# Patient Record
Sex: Male | Born: 1985 | Race: White | Hispanic: No | Marital: Single | Smoking: Never smoker
Health system: Southern US, Community
[De-identification: ages and names within clinical notes are randomized; demographics above are authoritative.]

---
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
## 2006-12-12 ENCOUNTER — Ambulatory Visit: Payer: Self-pay | Admitting: General Surgery

## 2006-12-12 ENCOUNTER — Encounter: Admission: RE | Admit: 2006-12-12 | Discharge: 2006-12-12 | Payer: Self-pay | Admitting: General Surgery

## 2007-11-12 ENCOUNTER — Ambulatory Visit: Payer: Self-pay

## 2007-11-15 ENCOUNTER — Ambulatory Visit: Payer: Self-pay

## 2007-11-19 ENCOUNTER — Ambulatory Visit: Payer: Self-pay

## 2007-11-26 ENCOUNTER — Ambulatory Visit: Payer: Self-pay

## 2007-12-03 ENCOUNTER — Ambulatory Visit: Payer: Self-pay

## 2007-12-06 ENCOUNTER — Ambulatory Visit: Payer: Self-pay

## 2008-01-02 ENCOUNTER — Ambulatory Visit: Payer: Self-pay

## 2008-01-07 ENCOUNTER — Ambulatory Visit: Payer: Self-pay

## 2008-01-28 ENCOUNTER — Ambulatory Visit: Payer: Self-pay

## 2008-02-04 ENCOUNTER — Ambulatory Visit: Payer: Self-pay

## 2008-02-24 DIAGNOSIS — E032 Hypothyroidism due to medicaments and other exogenous substances: Secondary | ICD-10-CM | POA: Insufficient documentation

## 2008-05-19 IMAGING — CR DG CHEST 1V
1 series · 1 of 1 positions shown · non-contrast
Comparison: none

CLINICAL DATA: Hodgkin?s disease.  Recent biopsy with pneumothorax. 
 ONE VIEW CHEST:

[w chest pa]
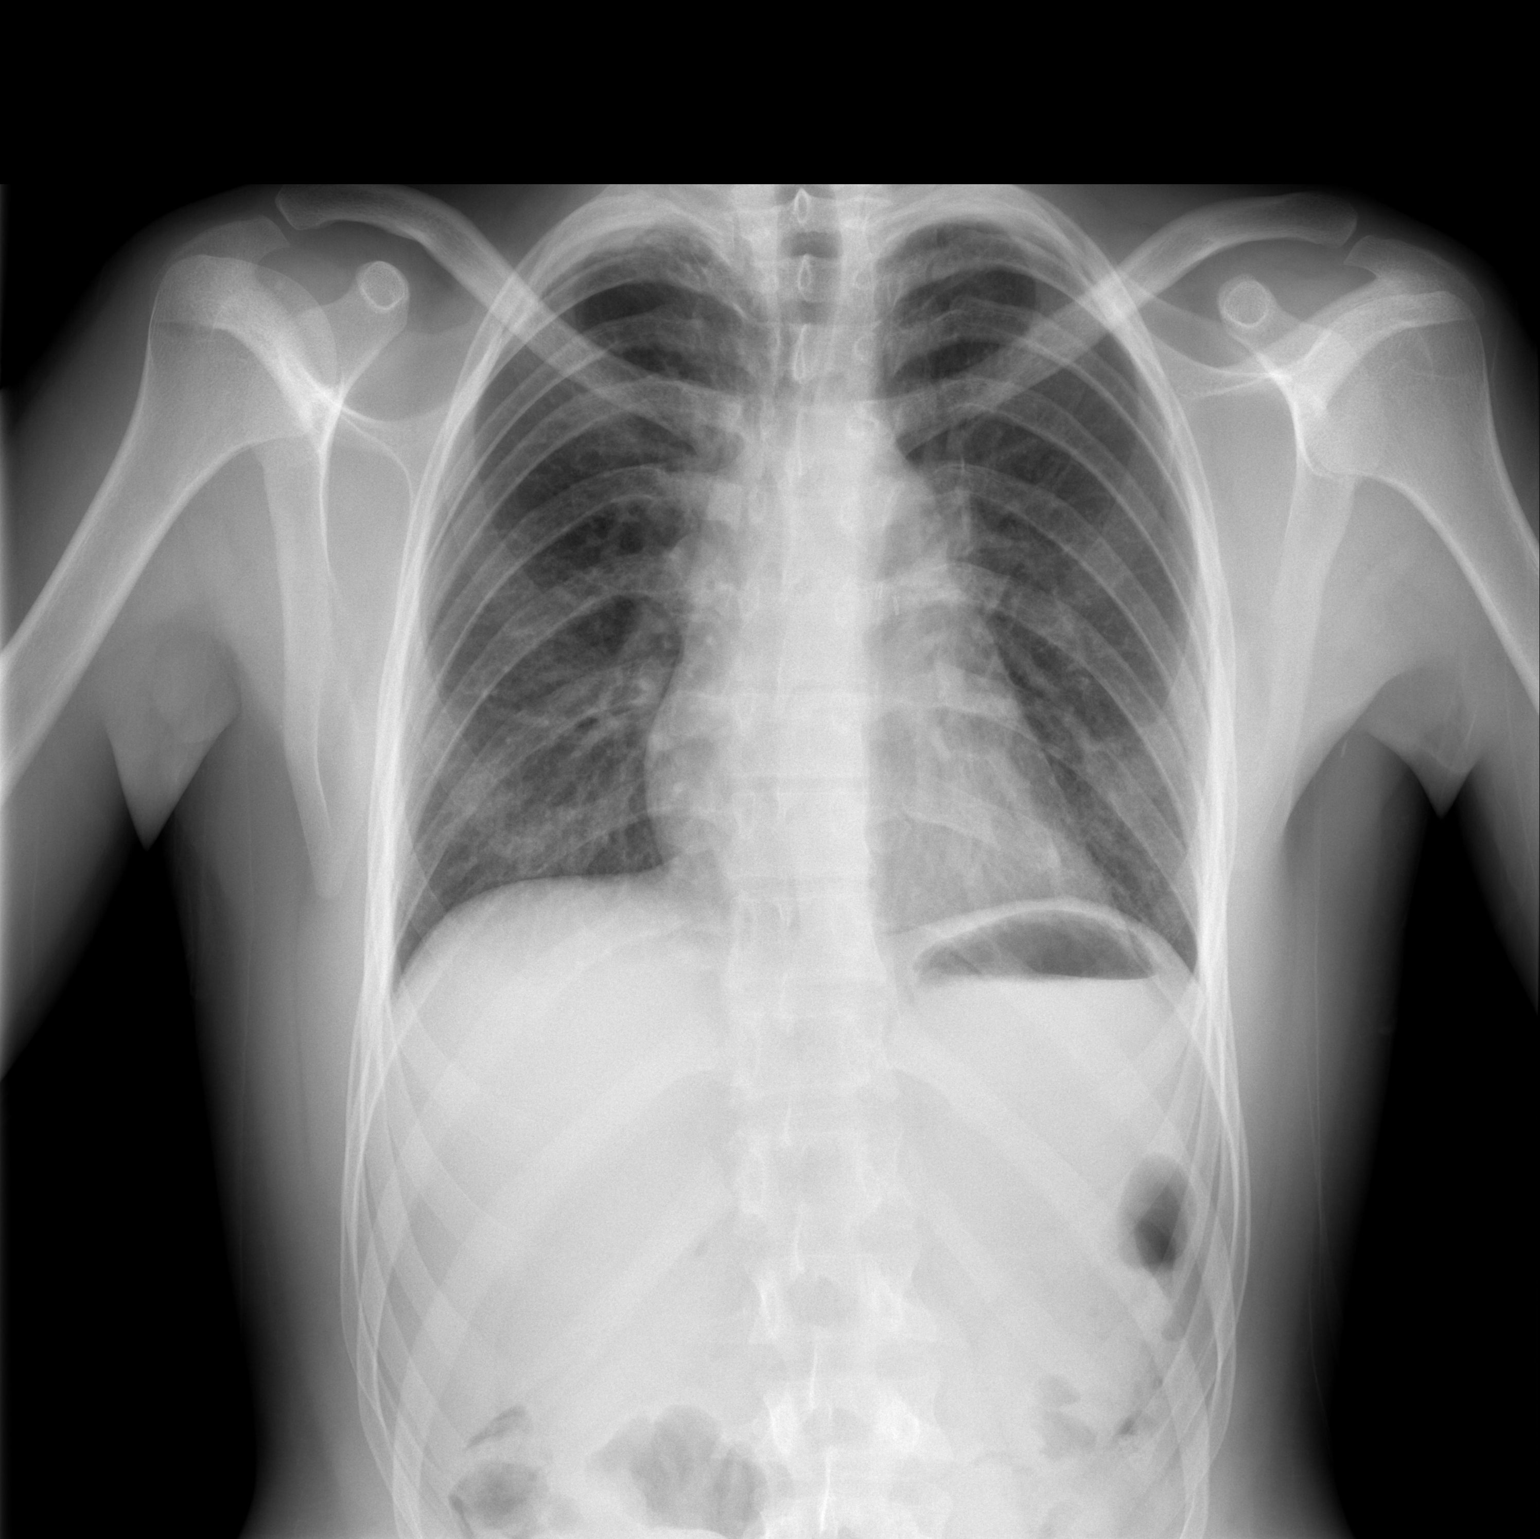

[1 of 1 positions shown; findings below may reference images not displayed]

FINDINGS: There is fullness of the superior mediastinum which may be due to adenopathy.  A surgical clip overlies the left hilum.  No pneumothorax or pleural effusion is seen.  The interstitial markings are prominent.  There is no confluent air space opacity.
IMPRESSION: No evidence of pneumothorax.  Interstitial prominence and possible mediastinal adenopathy ? correlate with prior imaging studies.

## 2012-10-17 DIAGNOSIS — C8190 Hodgkin lymphoma, unspecified, unspecified site: Secondary | ICD-10-CM | POA: Insufficient documentation

## 2012-10-17 DIAGNOSIS — J939 Pneumothorax, unspecified: Secondary | ICD-10-CM | POA: Insufficient documentation

## 2013-04-04 DIAGNOSIS — D239 Other benign neoplasm of skin, unspecified: Secondary | ICD-10-CM | POA: Insufficient documentation

## 2013-04-04 DIAGNOSIS — J8410 Pulmonary fibrosis, unspecified: Secondary | ICD-10-CM | POA: Insufficient documentation

## 2015-02-16 DIAGNOSIS — J42 Unspecified chronic bronchitis: Secondary | ICD-10-CM | POA: Insufficient documentation

## 2015-10-26 DIAGNOSIS — D849 Immunodeficiency, unspecified: Secondary | ICD-10-CM | POA: Insufficient documentation

## 2015-10-26 DIAGNOSIS — Z942 Lung transplant status: Secondary | ICD-10-CM | POA: Insufficient documentation

## 2015-11-02 DIAGNOSIS — B259 Cytomegaloviral disease, unspecified: Secondary | ICD-10-CM | POA: Insufficient documentation

## 2016-05-18 DIAGNOSIS — Z8619 Personal history of other infectious and parasitic diseases: Secondary | ICD-10-CM | POA: Insufficient documentation

## 2016-05-18 DIAGNOSIS — Z8709 Personal history of other diseases of the respiratory system: Secondary | ICD-10-CM | POA: Insufficient documentation

## 2016-05-18 DIAGNOSIS — Z9889 Other specified postprocedural states: Secondary | ICD-10-CM | POA: Insufficient documentation

## 2016-06-07 DIAGNOSIS — J9809 Other diseases of bronchus, not elsewhere classified: Secondary | ICD-10-CM | POA: Insufficient documentation

## 2016-07-18 DIAGNOSIS — J479 Bronchiectasis, uncomplicated: Secondary | ICD-10-CM | POA: Insufficient documentation

## 2016-08-16 DIAGNOSIS — D801 Nonfamilial hypogammaglobulinemia: Secondary | ICD-10-CM | POA: Insufficient documentation

## 2019-01-25 DIAGNOSIS — Z85820 Personal history of malignant melanoma of skin: Secondary | ICD-10-CM | POA: Insufficient documentation

## 2019-06-06 DIAGNOSIS — R634 Abnormal weight loss: Secondary | ICD-10-CM | POA: Insufficient documentation

## 2019-07-30 DIAGNOSIS — D89811 Chronic graft-versus-host disease: Secondary | ICD-10-CM | POA: Insufficient documentation

## 2019-12-27 DIAGNOSIS — C678 Malignant neoplasm of overlapping sites of bladder: Secondary | ICD-10-CM | POA: Insufficient documentation

## 2020-01-18 DIAGNOSIS — D591 Autoimmune hemolytic anemia, unspecified: Secondary | ICD-10-CM | POA: Insufficient documentation

## 2020-01-24 DIAGNOSIS — R7689 Other specified abnormal immunological findings in serum: Secondary | ICD-10-CM | POA: Insufficient documentation

## 2020-01-24 DIAGNOSIS — R768 Other specified abnormal immunological findings in serum: Secondary | ICD-10-CM | POA: Insufficient documentation

## 2020-04-01 DIAGNOSIS — C689 Malignant neoplasm of urinary organ, unspecified: Secondary | ICD-10-CM | POA: Insufficient documentation

## 2020-05-08 DIAGNOSIS — H40043 Steroid responder, bilateral: Secondary | ICD-10-CM | POA: Insufficient documentation

## 2021-02-19 DIAGNOSIS — J95811 Postprocedural pneumothorax: Secondary | ICD-10-CM | POA: Insufficient documentation

## 2021-04-12 DIAGNOSIS — E161 Other hypoglycemia: Secondary | ICD-10-CM | POA: Insufficient documentation

## 2021-04-12 DIAGNOSIS — T86810 Lung transplant rejection: Secondary | ICD-10-CM | POA: Insufficient documentation

## 2021-10-07 ENCOUNTER — Encounter: Payer: Self-pay | Admitting: *Deleted

## 2021-10-07 ENCOUNTER — Encounter
Payer: BC Managed Care – PPO | Attending: Student in an Organized Health Care Education/Training Program | Admitting: *Deleted

## 2021-10-07 VITALS — Ht 71.0 in | Wt 119.0 lb

## 2021-10-07 DIAGNOSIS — Z942 Lung transplant status: Secondary | ICD-10-CM | POA: Insufficient documentation

## 2021-10-07 DIAGNOSIS — R0609 Other forms of dyspnea: Secondary | ICD-10-CM | POA: Diagnosis present

## 2021-10-07 NOTE — Patient Instructions (Addendum)
Patient Instructions  Patient Details  Name: Daniel Vaughan MRN: [id number] Date of Birth: August 17, 1985 Referring Provider:  Whitson, [place],*  Below are your personal goals for exercise, nutrition, and risk factors. Our goal is to help you stay on track towards obtaining and maintaining these goals. We will be discussing your progress on these goals with you throughout the program.  Initial Exercise Prescription:  Initial Exercise Prescription - 10/07/21 1500       Date of Initial Exercise RX and Referring Provider   Date 10/07/21    Referring Provider Whitson, [place] MD      Oxygen   Maintain Oxygen Saturation 88% or higher      Treadmill   MPH 2    Grade 0.5    Minutes 15    METs 2.67      Recumbant Bike   Level 2    RPM 50    Watts 10    Minutes 15    METs 2      NuStep   Level 4    SPM 80    Minutes 15    METs 2.5      REL-XR   Level 2    Speed 50    Minutes 15    METs 2.5      Track   Laps 29    Minutes 15    METs 2.58      Prescription Details   Frequency (times per week) 3    Duration Progress to 30 minutes of continuous aerobic without signs/symptoms of physical distress      Intensity   THRR 40-80% of Max Heartrate 139-170    Ratings of Perceived Exertion 11-13    Perceived Dyspnea 0-4      Progression   Progression Continue to progress workloads to maintain intensity without signs/symptoms of physical distress.      Resistance Training   Training Prescription Yes    Weight 4 lb    Reps 10-15             Exercise Goals: Frequency: Be able to perform aerobic exercise two to three times per week in program working toward 2-5 days per week of home exercise.  Intensity: Work with a perceived exertion of 11 (fairly light) - 15 (hard) while following your exercise prescription.  We will make changes to your prescription with you as you progress through the program.   Duration: Be able to do 30 to 45 minutes of continuous  aerobic exercise in addition to a 5 minute warm-up and a 5 minute cool-down routine.   Nutrition Goals: Your personal nutrition goals will be established when you do your nutrition analysis with the dietician.  The following are general nutrition guidelines to follow: Cholesterol < '200mg'$ /day Sodium < '1500mg'$ /day Fiber: Men under 50 yrs - 38 grams per day  Personal Goals:  Personal Goals and Risk Factors at Admission - 10/07/21 1605       Core Components/Risk Factors/Patient Goals on Admission    Weight Management Yes;Weight Gain    Intervention Weight Management: Develop a combined nutrition and exercise program designed to reach desired caloric intake, while maintaining appropriate intake of nutrient and fiber, sodium and fats, and appropriate energy expenditure required for the weight goal.;Weight Management: Provide education and appropriate resources to help participant work on and attain dietary goals.    Admit Weight 119 lb (54 kg)    Goal Weight: Short Term 125 lb (56.7 kg)    Goal  Weight: Long Term 130 lb (59 kg)    Expected Outcomes Short Term: Continue to assess and modify interventions until short term weight is achieved;Long Term: Adherence to nutrition and physical activity/exercise program aimed toward attainment of established weight goal;Weight Gain: Understanding of general recommendations for a high calorie, high protein meal plan that promotes weight gain by distributing calorie intake throughout the day with the consumption for 4-5 meals, snacks, and/or supplements    Improve shortness of breath with ADL's Yes    Intervention Provide education, individualized exercise plan and daily activity instruction to help decrease symptoms of SOB with activities of daily living.    Expected Outcomes Long Term: Be able to perform more ADLs without symptoms or delay the onset of symptoms;Short Term: Improve cardiorespiratory fitness to achieve a reduction of symptoms when performing ADLs     Increase knowledge of respiratory medications and ability to use respiratory devices properly  Yes    Intervention Provide education and demonstration as needed of appropriate use of medications, inhalers, and oxygen therapy.    Expected Outcomes Long Term: Maintain appropriate use of medications, inhalers, and oxygen therapy.;Short Term: Achieves understanding of medications use. Understands that oxygen is a medication prescribed by physician. Demonstrates appropriate use of inhaler and oxygen therapy.    Hypertension Yes    Intervention Provide education on lifestyle modifcations including regular physical activity/exercise, weight management, moderate sodium restriction and increased consumption of fresh fruit, vegetables, and low fat dairy, alcohol moderation, and smoking cessation.;Monitor prescription use compliance.    Expected Outcomes Short Term: Continued assessment and intervention until BP is < 140/72m HG in hypertensive participants. < 130/866mHG in hypertensive participants with diabetes, heart failure or chronic kidney disease.;Long Term: Maintenance of blood pressure at goal levels.             Tobacco Use Initial Evaluation: Social History   Tobacco Use  Smoking Status Never  Smokeless Tobacco Never    Exercise Goals and Review:  Exercise Goals     Row Name 10/07/21 1548             Exercise Goals   Increase Physical Activity Yes       Intervention Provide advice, education, support and counseling about physical activity/exercise needs.;Develop an individualized exercise prescription for aerobic and resistive training based on initial evaluation findings, risk stratification, comorbidities and participant's personal goals.       Expected Outcomes Short Term: Attend rehab on a regular basis to increase amount of physical activity.;Long Term: Add in home exercise to make exercise part of routine and to increase amount of physical activity.;Long Term: Exercising  regularly at least 3-5 days a week.       Increase Strength and Stamina Yes       Intervention Provide advice, education, support and counseling about physical activity/exercise needs.;Develop an individualized exercise prescription for aerobic and resistive training based on initial evaluation findings, risk stratification, comorbidities and participant's personal goals.       Expected Outcomes Short Term: Increase workloads from initial exercise prescription for resistance, speed, and METs.;Short Term: Perform resistance training exercises routinely during rehab and add in resistance training at home;Long Term: Improve cardiorespiratory fitness, muscular endurance and strength as measured by increased METs and functional capacity (6MWT)       Able to understand and use rate of perceived exertion (RPE) scale Yes       Intervention Provide education and explanation on how to use RPE scale  Expected Outcomes Long Term:  Able to use RPE to guide intensity level when exercising independently;Short Term: Able to use RPE daily in rehab to express subjective intensity level       Able to understand and use Dyspnea scale Yes       Intervention Provide education and explanation on how to use Dyspnea scale       Expected Outcomes Short Term: Able to use Dyspnea scale daily in rehab to express subjective sense of shortness of breath during exertion;Long Term: Able to use Dyspnea scale to guide intensity level when exercising independently       Knowledge and understanding of Target Heart Rate Range (THRR) Yes       Intervention Provide education and explanation of THRR including how the numbers were predicted and where they are located for reference       Expected Outcomes Short Term: Able to state/look up THRR;Short Term: Able to use daily as guideline for intensity in rehab;Long Term: Able to use THRR to govern intensity when exercising independently       Able to check pulse independently Yes        Intervention Provide education and demonstration on how to check pulse in carotid and radial arteries.;Review the importance of being able to check your own pulse for safety during independent exercise       Expected Outcomes Short Term: Able to explain why pulse checking is important during independent exercise;Long Term: Able to check pulse independently and accurately       Understanding of Exercise Prescription Yes       Intervention Provide education, explanation, and written materials on patient's individual exercise prescription       Expected Outcomes Short Term: Able to explain program exercise prescription;Long Term: Able to explain home exercise prescription to exercise independently                Copy of goals given to participant.

## 2021-10-07 NOTE — Progress Notes (Signed)
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[place])  Patient's Home Medications on Admission:  Current Outpatient Medications:    acetaminophen (TYLENOL) 500 MG tablet, Take by mouth., Disp: , Rfl:    albuterol (PROVENTIL) (2.5 MG/3ML) 0.083% nebulizer solution, Inhale into the lungs., Disp: , Rfl:    atovaquone (MEPRON) 750 MG/5ML suspension, Take by mouth., Disp: , Rfl:    azithromycin (ZITHROMAX) 250 MG tablet, TAKE 1 TABLET BY MOUTH ONCE DAILY FOR 90DAYS, Disp: , Rfl:    beclomethasone (QVAR) 80 MCG/ACT inhaler, Inhale into the lungs., Disp: , Rfl:    Belumosudil Mesylate 200 MG TABS, Take by mouth., Disp: , Rfl:    budesonide (ENTOCORT EC) 3 MG 24 hr capsule, Take by mouth., Disp: , Rfl:    ethambutol (MYAMBUTOL) 400 MG tablet, Take by mouth., Disp: , Rfl:    Immune Globulin 10% (GAMUNEX-C) 10 GM/100ML SOLN, Inject into the vein., Disp: , Rfl:    latanoprost (XALATAN) 0.005 % ophthalmic solution, Apply to eye., Disp: , Rfl:    levothyroxine (SYNTHROID) 75 MCG tablet, Take by mouth., Disp: , Rfl:    Magnesium Bisglycinate (MAG GLYCINATE) 100 MG TABS, Take by mouth., Disp: , Rfl:    mirtazapine (REMERON) 7.5 MG tablet, Take by mouth., Disp: , Rfl:    montelukast (SINGULAIR) 10 MG tablet, Take 1 tablet by mouth at bedtime., Disp: , Rfl:    moxifloxacin (AVELOX) 400 MG tablet, Take 1 tablet by mouth daily., Disp: , Rfl:    niacinamide 500 MG tablet, Take by mouth., Disp: , Rfl:    oxybutynin (DITROPAN) 5 MG tablet, Take by mouth., Disp: , Rfl:    posaconazole (NOXAFIL)  100 MG TBEC delayed-release tablet, Take by mouth., Disp: , Rfl:    simethicone (MYLICON) 80 MG chewable tablet, Chew by mouth., Disp: , Rfl:    sodium bicarbonate 650 MG tablet, Take by mouth., Disp: , Rfl:    tacrolimus (PROGRAF) 0.5 MG capsule, Take 0.'5mg'$  by mouth every 48 hours; take in the AM, Disp: , Rfl:    tretinoin (RETIN-A) 0.025 % cream, Apply a thin layer (pea sized amount) to sun damaged areas of scalp and face at night. Wash off in the morning and apply moisturizer and sunscreen., Disp: , Rfl:    valGANciclovir (VALCYTE) 450 MG tablet, Take by mouth., Disp: , Rfl:    albuterol (VENTOLIN HFA) 108 (90 Base) MCG/ACT inhaler, Inhale into the lungs., Disp: , Rfl:    butalbital-acetaminophen-caffeine (FIORICET) 50-325-40 MG tablet, Take by mouth., Disp: , Rfl:    cetirizine (ZYRTEC) 10 MG tablet, Take by mouth., Disp: , Rfl:    Cholecalciferol 50 MCG (2000 UT) CAPS, Take by mouth., Disp: , Rfl:    Cyanocobalamin (VITAMIN B 12) 100 MCG LOZG, Take 1 tablet by mouth daily., Disp: , Rfl:    lidocaine 23% - tetracaine 7% topical ointment, plasticized,, Apply topically., Disp: , Rfl:    metoprolol tartrate (LOPRESSOR) 25 MG tablet, Take by mouth., Disp: , Rfl:    ondansetron (ZOFRAN) 4 MG tablet, Take 4 mg by mouth every 8 (  eight) hours as needed., Disp: , Rfl:    prochlorperazine (COMPAZINE) 10 MG tablet, Take by mouth., Disp: , Rfl:   Past Medical History: History reviewed. No pertinent past medical history.  Tobacco Use: Social History   Tobacco Use  Smoking Status Never  Smokeless Tobacco Never    Labs: Review Flowsheet        No data to display           Pulmonary Assessment Scores:  Pulmonary Assessment Scores     Row Name 10/07/21 1606         ADL UCSD   ADL Phase Entry     SOB Score total 81     Rest 1     Walk 3     Stairs 5     Bath 3     Dress 3     Shop 3       CAT Score   CAT Score 29       mMRC Score   mMRC Score 3               UCSD: Self-administered rating of dyspnea associated with activities of daily living (ADLs) 6-point scale (0 = "not at all" to 5 = "maximal or unable to do because of breathlessness")  Scoring Scores range from 0 to 120.  Minimally important difference is 5 units  CAT: CAT can identify the health impairment of COPD patients and is better correlated with disease progression.  CAT has a scoring range of zero to 40. The CAT score is classified into four groups of low (less than 10), medium (10 - 20), high (21-30) and very high (31-40) based on the impact level of disease on health status. A CAT score over 10 suggests significant symptoms.  A worsening CAT score could be explained by an exacerbation, poor medication adherence, poor inhaler technique, or progression of COPD or comorbid conditions.  CAT MCID is 2 points  mMRC: mMRC (Modified Medical Research Council) Dyspnea Scale is used to assess the degree of baseline functional disability in patients of respiratory disease due to dyspnea. No minimal important difference is established. A decrease in score of 1 point or greater is considered a positive change.   Pulmonary Function Assessment:   Exercise Target Goals: Exercise Program Goal: Individual exercise prescription set using results from initial 6 min walk test and THRR while considering  patient's activity barriers and safety.   Exercise Prescription Goal: Initial exercise prescription builds to 30-45 minutes a day of aerobic activity, 2-3 days per week.  Home exercise guidelines will be given to patient during program as part of exercise prescription that the participant will acknowledge.  Education: Aerobic Exercise: - Group verbal and visual presentation on the components of exercise prescription. Introduces F.I.T.T principle from ACSM for exercise prescriptions.  Reviews F.I.T.T. principles of aerobic exercise including progression. Written material given at  graduation.   Education: Resistance Exercise: - Group verbal and visual presentation on the components of exercise prescription. Introduces F.I.T.T principle from ACSM for exercise prescriptions  Reviews F.I.T.T. principles of resistance exercise including progression. Written material given at graduation.    Education: Exercise & Equipment Safety: - Individual verbal instruction and demonstration of equipment use and safety with use of the equipment. Flowsheet Row Pulmonary Rehab from 10/07/2021 in [facility] Cardiac and Pulmonary Rehab  Date 10/07/21  Educator [facility]  Instruction Review Code 1- Verbalizes Understanding       Education: Exercise Physiology & General Exercise Guidelines: -  Group verbal and written instruction with models to review the exercise physiology of the cardiovascular system and associated critical values. Provides general exercise guidelines with specific guidelines to those with heart or lung disease.    Education: Flexibility, Balance, Mind/Body Relaxation: - Group verbal and visual presentation with interactive activity on the components of exercise prescription. Introduces F.I.T.T principle from ACSM for exercise prescriptions. Reviews F.I.T.T. principles of flexibility and balance exercise training including progression. Also discusses the mind body connection.  Reviews various relaxation techniques to help reduce and manage stress (i.e. Deep breathing, progressive muscle relaxation, and visualization). Balance handout provided to take home. Written material given at graduation.   Activity Barriers & Risk Stratification:  Activity Barriers & Cardiac Risk Stratification - 10/07/21 1202       Activity Barriers & Cardiac Risk Stratification   Activity Barriers Deconditioning;Muscular Weakness;Shortness of Breath             6 Minute Walk:  6 Minute Walk     Row Name 10/07/21 1540         6 Minute Walk   Phase Initial     Distance 1210 feet     Walk  Time 6 minutes     # of Rest Breaks 0     MPH 2.27     METS 6.02     RPE 15     Perceived Dyspnea  4     VO2 Peak 21.08     Symptoms Yes (comment)     Comments SOB     Resting HR 108 bpm     Resting BP 122/60     Resting Oxygen Saturation  96 %     Exercise Oxygen Saturation  during 6 min walk 92 %     Max Ex. HR 137 bpm     Max Ex. BP 156/74     2 Minute Post BP 142/72             Oxygen Initial Assessment:  Oxygen Initial Assessment - 10/07/21 1207       Home Oxygen   Home Oxygen Device None    Sleep Oxygen Prescription None    Home Exercise Oxygen Prescription None    Home Resting Oxygen Prescription None    Compliance with Home Oxygen Use Yes      Initial 6 min Walk   Oxygen Used None      Program Oxygen Prescription   Program Oxygen Prescription None      Intervention   Short Term Goals To learn and understand importance of monitoring SPO2 with pulse oximeter and demonstrate accurate use of the pulse oximeter.;To learn and understand importance of maintaining oxygen saturations>88%;To learn and demonstrate proper pursed lip breathing techniques or other breathing techniques. ;To learn and demonstrate proper use of respiratory medications    Long  Term Goals Verbalizes importance of monitoring SPO2 with pulse oximeter and return demonstration;Maintenance of O2 saturations>88%;Exhibits proper breathing techniques, such as pursed lip breathing or other method taught during program session;Compliance with respiratory medication;Demonstrates proper use of MDI's             Oxygen Re-Evaluation:   Oxygen Discharge (Final Oxygen Re-Evaluation):   Initial Exercise Prescription:  Initial Exercise Prescription - 10/07/21 1500       Date of Initial Exercise RX and Referring Provider   Date 10/07/21    Referring Provider Whitson, [place] MD      Oxygen   Maintain Oxygen Saturation 88% or higher  Treadmill   MPH 2    Grade 0.5    Minutes 15    METs  2.67      Recumbant Bike   Level 2    RPM 50    Watts 10    Minutes 15    METs 2      NuStep   Level 4    SPM 80    Minutes 15    METs 2.5      REL-XR   Level 2    Speed 50    Minutes 15    METs 2.5      Track   Laps 29    Minutes 15    METs 2.58      Prescription Details   Frequency (times per week) 3    Duration Progress to 30 minutes of continuous aerobic without signs/symptoms of physical distress      Intensity   THRR 40-80% of Max Heartrate 139-170    Ratings of Perceived Exertion 11-13    Perceived Dyspnea 0-4      Progression   Progression Continue to progress workloads to maintain intensity without signs/symptoms of physical distress.      Resistance Training   Training Prescription Yes    Weight 4 lb    Reps 10-15             Perform Capillary Blood Glucose checks as needed.  Exercise Prescription Changes:   Exercise Prescription Changes     Row Name 10/07/21 1500             Response to Exercise   Blood Pressure (Admit) 122/60       Blood Pressure (Exercise) 156/74       Blood Pressure (Exit) 124/70       Heart Rate (Admit) 108 bpm       Heart Rate (Exercise) 137 bpm       Heart Rate (Exit) 118 bpm       Oxygen Saturation (Admit) 96 %       Oxygen Saturation (Exercise) 92 %       Oxygen Saturation (Exit) 97 %       Rating of Perceived Exertion (Exercise) 15       Perceived Dyspnea (Exercise) 4       Symptoms SOB       Comments walk test results                Exercise Comments:   Exercise Goals and Review:   Exercise Goals     Row Name 10/07/21 1548             Exercise Goals   Increase Physical Activity Yes       Intervention Provide advice, education, support and counseling about physical activity/exercise needs.;Develop an individualized exercise prescription for aerobic and resistive training based on initial evaluation findings, risk stratification, comorbidities and participant's personal goals.        Expected Outcomes Short Term: Attend rehab on a regular basis to increase amount of physical activity.;Long Term: Add in home exercise to make exercise part of routine and to increase amount of physical activity.;Long Term: Exercising regularly at least 3-5 days a week.       Increase Strength and Stamina Yes       Intervention Provide advice, education, support and counseling about physical activity/exercise needs.;Develop an individualized exercise prescription for aerobic and resistive training based on initial evaluation findings, risk stratification, comorbidities and participant's personal goals.  Expected Outcomes Short Term: Increase workloads from initial exercise prescription for resistance, speed, and METs.;Short Term: Perform resistance training exercises routinely during rehab and add in resistance training at home;Long Term: Improve cardiorespiratory fitness, muscular endurance and strength as measured by increased METs and functional capacity (6MWT)       Able to understand and use rate of perceived exertion (RPE) scale Yes       Intervention Provide education and explanation on how to use RPE scale       Expected Outcomes Long Term:  Able to use RPE to guide intensity level when exercising independently;Short Term: Able to use RPE daily in rehab to express subjective intensity level       Able to understand and use Dyspnea scale Yes       Intervention Provide education and explanation on how to use Dyspnea scale       Expected Outcomes Short Term: Able to use Dyspnea scale daily in rehab to express subjective sense of shortness of breath during exertion;Long Term: Able to use Dyspnea scale to guide intensity level when exercising independently       Knowledge and understanding of Target Heart Rate Range (THRR) Yes       Intervention Provide education and explanation of THRR including how the numbers were predicted and where they are located for reference       Expected Outcomes Short  Term: Able to state/look up THRR;Short Term: Able to use daily as guideline for intensity in rehab;Long Term: Able to use THRR to govern intensity when exercising independently       Able to check pulse independently Yes       Intervention Provide education and demonstration on how to check pulse in carotid and radial arteries.;Review the importance of being able to check your own pulse for safety during independent exercise       Expected Outcomes Short Term: Able to explain why pulse checking is important during independent exercise;Long Term: Able to check pulse independently and accurately       Understanding of Exercise Prescription Yes       Intervention Provide education, explanation, and written materials on patient's individual exercise prescription       Expected Outcomes Short Term: Able to explain program exercise prescription;Long Term: Able to explain home exercise prescription to exercise independently                Exercise Goals Re-Evaluation :   Discharge Exercise Prescription (Final Exercise Prescription Changes):  Exercise Prescription Changes - 10/07/21 1500       Response to Exercise   Blood Pressure (Admit) 122/60    Blood Pressure (Exercise) 156/74    Blood Pressure (Exit) 124/70    Heart Rate (Admit) 108 bpm    Heart Rate (Exercise) 137 bpm    Heart Rate (Exit) 118 bpm    Oxygen Saturation (Admit) 96 %    Oxygen Saturation (Exercise) 92 %    Oxygen Saturation (Exit) 97 %    Rating of Perceived Exertion (Exercise) 15    Perceived Dyspnea (Exercise) 4    Symptoms SOB    Comments walk test results             Nutrition:  Target Goals: Understanding of nutrition guidelines, daily intake of sodium '1500mg'$ , cholesterol '200mg'$ , calories 30% from fat and 7% or less from saturated fats, daily to have 5 or more servings of fruits and vegetables.  Education: All About Nutrition: -Group instruction provided by verbal,  written material, interactive  activities, discussions, models, and posters to present general guidelines for heart healthy nutrition including fat, fiber, MyPlate, the role of sodium in heart healthy nutrition, utilization of the nutrition label, and utilization of this knowledge for meal planning. Follow up email sent as well. Written material given at graduation.   Biometrics:  Pre Biometrics - 10/07/21 1548       Pre Biometrics   Height '5\' 11"'$  (1.803 m)    Weight 119 lb (54 kg)    BMI (Calculated) 16.6    Single Leg Stand 30 seconds              Nutrition Therapy Plan and Nutrition Goals:  Nutrition Therapy & Goals - 10/07/21 1207       Intervention Plan   Intervention Prescribe, educate and counsel regarding individualized specific dietary modifications aiming towards targeted core components such as weight, hypertension, lipid management, diabetes, heart failure and other comorbidities.    Expected Outcomes Short Term Goal: Understand basic principles of dietary content, such as calories, fat, sodium, cholesterol and nutrients.;Short Term Goal: A plan has been developed with personal nutrition goals set during dietitian appointment.;Long Term Goal: Adherence to prescribed nutrition plan.             Nutrition Assessments:  MEDIFICTS Score Key: ?70 Need to make dietary changes  40-70 Heart Healthy Diet ? 40 Therapeutic Level Cholesterol Diet  Flowsheet Row Pulmonary Rehab from 10/07/2021 in [facility] Cardiac and Pulmonary Rehab  Picture Your Plate Total Score on Admission 38      Picture Your Plate Scores: <16 Unhealthy dietary pattern with much room for improvement. 41-50 Dietary pattern unlikely to meet recommendations for good health and room for improvement. 51-60 More healthful dietary pattern, with some room for improvement.  >60 Healthy dietary pattern, although there may be some specific behaviors that could be improved.   Nutrition Goals Re-Evaluation:   Nutrition Goals Discharge  (Final Nutrition Goals Re-Evaluation):   Psychosocial: Target Goals: Acknowledge presence or absence of significant depression and/or stress, maximize coping skills, provide positive support system. Participant is able to verbalize types and ability to use techniques and skills needed for reducing stress and depression.   Education: Stress, Anxiety, and Depression - Group verbal and visual presentation to define topics covered.  Reviews how body is impacted by stress, anxiety, and depression.  Also discusses healthy ways to reduce stress and to treat/manage anxiety and depression.  Written material given at graduation.   Education: Sleep Hygiene -Provides group verbal and written instruction about how sleep can affect your health.  Define sleep hygiene, discuss sleep cycles and impact of sleep habits. Review good sleep hygiene tips.    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 Yes   wife, mom comes to help as well     Barriers   Psychosocial barriers to participate in program Psychosocial barriers identified (see note);The patient should benefit from training in stress management and relaxation.      Screening Interventions   Interventions Encouraged to exercise;To provide support and resources with identified psychosocial  needs;Provide feedback about the scores to participant    Expected Outcomes Short Term goal: Utilizing psychosocial counselor, staff and physician to assist with identification of specific Stressors or current issues interfering with healing process. Setting desired goal for each stressor  or current issue identified.;Long Term Goal: Stressors or current issues are controlled or eliminated.;Short Term goal: Identification and review with participant of any Quality of Life or Depression concerns found by scoring the questionnaire.;Long Term goal: The participant improves quality of Life and PHQ9 Scores as seen by post scores and/or verbalization of changes             Quality of Life Scores:  Scores of 19 and below usually indicate a poorer quality of life in these areas.  A difference of  2-3 points is a clinically meaningful difference.  A difference of 2-3 points in the total score of the Quality of Life Index has been associated with significant improvement in overall quality of life, self-image, physical symptoms, and general health in studies assessing change in quality of life.  PHQ-9: Review Flowsheet       10/07/2021  Depression screen PHQ 2/9  Decreased Interest 1  Down, Depressed, Hopeless 1  PHQ - 2 Score 2  Altered sleeping 1  Tired, decreased energy 3  Change in appetite 2  Feeling bad or failure about yourself  1  Trouble concentrating 1  Moving slowly or fidgety/restless 1  Suicidal thoughts 0  PHQ-9 Score 11  Difficult doing work/chores Somewhat difficult   Interpretation of Total Score  Total Score Depression Severity:  1-4 = Minimal depression, 5-9 = Mild depression, 10-14 = Moderate depression, 15-19 = Moderately severe depression, 20-27 = Severe depression   Psychosocial Evaluation and Intervention:  Psychosocial Evaluation - 10/07/21 1549       Psychosocial Evaluation & Interventions   Interventions Encouraged to exercise with the program and follow exercise prescription;Stress management education;Relaxation education    Comments Catalina Antigua is coming into pulmonary rehab for dsypnea on exertion, s/p bilateral lung transplant.  Since his transplant in 2017 he has had two bouts of COVID which both seemed mild (not requiring hospitalization).  He  started to notice just before COVID that he was starting to have difficulty with doing yard work and getting short of breath.  It has continued to worsen and he was worked up and felt to be fighting a fungal infection as well.  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  He is getting ECP treatments at [facility] currently.  He is working on his Brewing technologist in Investment banker, operational at [employer].  He is also working, but out on [employer] for rehab.  He has a great support system in his wife and mom.  They will be bringing him to classes each day to help as well.  He does not currently have any financial barriers to rehab. He really wants to work on gaining muscle and strength back.  He wants to have the stamina to be able to just go without getting out of breath and keep up with his wife.    Expected Outcomes Short: Build muscle Long: Improve stamina    Continue Psychosocial Services  Follow up required by staff             Psychosocial Re-Evaluation:   Psychosocial Discharge (Final Psychosocial Re-Evaluation):   Education: Education Goals: Education classes will be provided on a weekly basis, covering required topics. Participant will state understanding/return demonstration of topics  presented.  Learning Barriers/Preferences:  Learning Barriers/Preferences - 10/07/21 1207       Learning Barriers/Preferences   Learning Barriers Sight   glasses   Learning Preferences Verbal Instruction;Written Material             General Pulmonary Education Topics:  Infection Prevention: - Provides verbal and written material to individual with discussion of infection control including proper hand washing and proper equipment cleaning during exercise session. Flowsheet Row Pulmonary Rehab from 10/07/2021 in [facility] Cardiac and Pulmonary Rehab  Date 10/07/21  Educator [facility]  Instruction Review Code 1- Verbalizes Understanding       Falls Prevention: - Provides verbal and  written material to individual with discussion of falls prevention and safety. Flowsheet Row Pulmonary Rehab from 10/07/2021 in [facility] Cardiac and Pulmonary Rehab  Date 10/07/21  Educator [facility]  Instruction Review Code 1- Verbalizes Understanding       Chronic Lung Disease Review: - Group verbal instruction with posters, models, PowerPoint presentations and videos,  to review new updates, new respiratory medications, new advancements in procedures and treatments. Providing information on websites and "800" numbers for continued self-education. Includes information about supplement oxygen, available portable oxygen systems, continuous and intermittent flow rates, oxygen safety, concentrators, and Medicare reimbursement for oxygen. Explanation of Pulmonary Drugs, including class, frequency, complications, importance of spacers, rinsing mouth after steroid MDI's, and proper cleaning methods for nebulizers. Review of basic lung anatomy and physiology related to function, structure, and complications of lung disease. Review of risk factors. Discussion about methods for diagnosing sleep apnea and types of masks and machines for OSA. Includes a review of the use of types of environmental controls: home humidity, furnaces, filters, dust mite/pet prevention, HEPA vacuums. Discussion about weather changes, air quality and the benefits of nasal washing. Instruction on Warning signs, infection symptoms, calling MD promptly, preventive modes, and value of vaccinations. Review of effective airway clearance, coughing and/or vibration techniques. Emphasizing that all should Create an Action Plan. Written material given at graduation. Flowsheet Row Pulmonary Rehab from 10/07/2021 in [facility] Cardiac and Pulmonary Rehab  Education need identified 10/07/21       AED/CPR: - Group verbal and written instruction with the use of models to demonstrate the basic use of the AED with the basic ABC's of resuscitation.    Anatomy and  Cardiac Procedures: - Group verbal and visual presentation and models provide information about basic cardiac anatomy and function. Reviews the testing methods done to diagnose heart disease and the outcomes of the test results. Describes the treatment choices: Medical Management, Angioplasty, or Coronary Bypass Surgery for treating various heart conditions including Myocardial Infarction, Angina, Valve Disease, and Cardiac Arrhythmias.  Written material given at graduation.   Medication Safety: - Group verbal and visual instruction to review commonly prescribed medications for heart and lung disease. Reviews the medication, class of the drug, and side effects. Includes the steps to properly store meds and maintain the prescription regimen.  Written material given at graduation.   Other: -Provides group and verbal instruction on various topics (see comments)   Knowledge Questionnaire Score:  Knowledge Questionnaire Score - 10/07/21 1605       Knowledge Questionnaire Score   Pre Score 16/18              Core Components/Risk Factors/Patient Goals at Admission:  Personal Goals and Risk Factors at Admission - 10/07/21 1605       Core Components/Risk Factors/Patient Goals on Admission    Weight Management Yes;Weight Gain  Intervention Weight Management: Develop a combined nutrition and exercise program designed to reach desired caloric intake, while maintaining appropriate intake of nutrient and fiber, sodium and fats, and appropriate energy expenditure required for the weight goal.;Weight Management: Provide education and appropriate resources to help participant work on and attain dietary goals.    Admit Weight 119 lb (54 kg)    Goal Weight: Short Term 125 lb (56.7 kg)    Goal Weight: Long Term 130 lb (59 kg)    Expected Outcomes Short Term: Continue to assess and modify interventions until short term weight is achieved;Long Term: Adherence to nutrition and physical  activity/exercise program aimed toward attainment of established weight goal;Weight Gain: Understanding of general recommendations for a high calorie, high protein meal plan that promotes weight gain by distributing calorie intake throughout the day with the consumption for 4-5 meals, snacks, and/or supplements    Improve shortness of breath with ADL's Yes    Intervention Provide education, individualized exercise plan and daily activity instruction to help decrease symptoms of SOB with activities of daily living.    Expected Outcomes Long Term: Be able to perform more ADLs without symptoms or delay the onset of symptoms;Short Term: Improve cardiorespiratory fitness to achieve a reduction of symptoms when performing ADLs    Increase knowledge of respiratory medications and ability to use respiratory devices properly  Yes    Intervention Provide education and demonstration as needed of appropriate use of medications, inhalers, and oxygen therapy.    Expected Outcomes Long Term: Maintain appropriate use of medications, inhalers, and oxygen therapy.;Short Term: Achieves understanding of medications use. Understands that oxygen is a medication prescribed by physician. Demonstrates appropriate use of inhaler and oxygen therapy.    Hypertension Yes    Intervention Provide education on lifestyle modifcations including regular physical activity/exercise, weight management, moderate sodium restriction and increased consumption of fresh fruit, vegetables, and low fat dairy, alcohol moderation, and smoking cessation.;Monitor prescription use compliance.    Expected Outcomes Short Term: Continued assessment and intervention until BP is < 140/69m HG in hypertensive participants. < 130/839mHG in hypertensive participants with diabetes, heart failure or chronic kidney disease.;Long Term: Maintenance of blood pressure at goal levels.             Education:Diabetes - Individual verbal and written instruction to  review signs/symptoms of diabetes, desired ranges of glucose level fasting, after meals and with exercise. Acknowledge that pre and post exercise glucose checks will be done for 3 sessions at entry of program.   Know Your Numbers and Heart Failure: - Group verbal and visual instruction to discuss disease risk factors for cardiac and pulmonary disease and treatment options.  Reviews associated critical values for Overweight/Obesity, Hypertension, Cholesterol, and Diabetes.  Discusses basics of heart failure: signs/symptoms and treatments.  Introduces Heart Failure Zone chart for action plan for heart failure.  Written material given at graduation.   Core Components/Risk Factors/Patient Goals Review:    Core Components/Risk Factors/Patient Goals at Discharge (Final Review):    ITP Comments:  ITP Comments     Row Name 10/07/21 1436           ITP Comments Completed  initial medical review, 6MWT, and gym orientation. Initial ITP created and sent for review to Dr. FaZetta BillsMedical Director.                Comments: Initial ITP

## 2021-10-08 ENCOUNTER — Encounter: Payer: BC Managed Care – PPO | Admitting: *Deleted

## 2021-10-08 DIAGNOSIS — R0609 Other forms of dyspnea: Secondary | ICD-10-CM | POA: Diagnosis not present

## 2021-10-08 NOTE — Progress Notes (Signed)
Daily Session Note  Patient Details  Name: Daniel Vaughan MRN: [id number] Date of Birth: 1986/04/05 Referring Provider:   Flowsheet Row Pulmonary Rehab from 10/07/2021 in [facility] Cardiac and Pulmonary Rehab  Referring Provider Whitson, [place] MD       Encounter Date: 10/08/2021  Check In:  Session Check In - 10/08/21 1008       Check-In   Supervising physician immediately available to respond to emergencies See telemetry face sheet for immediately available ER MD    Location [facility]-Cardiac & Pulmonary Rehab    Staff Present Heath Lark, RN, BSN, CCRP;Jessica [place], MA, RCEP, CCRP, CCET;Joseph [place], [place]    Virtual Visit No    Medication changes reported     No    Fall or balance concerns reported    No    Warm-up and Cool-down Performed on first and last piece of equipment    Resistance Training Performed Yes    VAD Patient? No    PAD/SET Patient? No      Pain Assessment   Currently in Pain? No/denies                Social History   Tobacco Use  Smoking Status Never  Smokeless Tobacco Never    Goals Met:  Independence with exercise equipment Exercise tolerated well Personal goals reviewed No report of concerns or symptoms today  Goals Unmet:  Not Applicable  Comments: First full day of exercise!  Patient was oriented to gym and equipment including functions, settings, policies, and procedures.  Patient's individual exercise prescription and treatment plan were reviewed.  All starting workloads were established based on the results of the 6 minute walk test done at initial orientation visit.  The plan for exercise progression was also introduced and progression will be customized based on patient's performance and goals.    Dr. Emily Filbert is Medical Director for [place].  Dr. Ottie Glazier is Medical Director for [facility] Pulmonary Rehabilitation.

## 2021-10-11 DIAGNOSIS — R0609 Other forms of dyspnea: Secondary | ICD-10-CM | POA: Diagnosis not present

## 2021-10-11 NOTE — Progress Notes (Signed)
Daily Session Note  Patient Details  Name: Daniel Vaughan MRN: [id number] Date of Birth: 05-02-1985 Referring Provider:   Flowsheet Row Pulmonary Rehab from 10/07/2021 in [facility] Cardiac and Pulmonary Rehab  Referring Provider Whitson, [place] MD       Encounter Date: 10/11/2021  Check In:  Session Check In - 10/11/21 1529       Check-In   Supervising physician immediately available to respond to emergencies See telemetry face sheet for immediately available ER MD    Location [facility]-Cardiac & Pulmonary Rehab    Staff Present Justin Mend, RCP,RRT,BSRT;Shelda Truby Rosalia Hammers, MPA, [place], BS, ACSM CEP, Exercise Physiologist    Virtual Visit No    Medication changes reported     No    Fall or balance concerns reported    No    Warm-up and Cool-down Performed on first and last piece of equipment    Resistance Training Performed Yes    VAD Patient? No    PAD/SET Patient? No      Pain Assessment   Currently in Pain? No/denies                Social History   Tobacco Use  Smoking Status Never  Smokeless Tobacco Never    Goals Met:  Independence with exercise equipment Exercise tolerated well No report of concerns or symptoms today Strength training completed today  Goals Unmet:  Not Applicable  Comments: Pt able to follow exercise prescription today without complaint.  Will continue to monitor for progression.    Dr. Emily Filbert is Medical Director for [place].  Dr. Ottie Glazier is Medical Director for [facility] Pulmonary Rehabilitation.

## 2021-10-13 DIAGNOSIS — R0609 Other forms of dyspnea: Secondary | ICD-10-CM

## 2021-10-13 NOTE — Progress Notes (Signed)
Daily Session Note  Patient Details  Name: Daniel Vaughan MRN: [id number] Date of Birth: 05-23-1985 Referring Provider:   Flowsheet Row Pulmonary Rehab from 10/07/2021 in [facility] Cardiac and Pulmonary Rehab  Referring Provider Whitson, [place] MD       Encounter Date: 10/13/2021  Check In:  Session Check In - 10/13/21 1526       Check-In   Supervising physician immediately available to respond to emergencies See telemetry face sheet for immediately available ER MD    Location [facility]-Cardiac & Pulmonary Rehab    Staff Present Justin Mend, RCP,RRT,BSRT;Rainy Rothman Rosalia Hammers, MPA, RN;Susanne Bice, RN, BSN, CCRP    Virtual Visit No    Medication changes reported     Yes    Comments Resircok    Fall or balance concerns reported    No    Warm-up and Cool-down Performed on first and last piece of equipment    Resistance Training Performed Yes    VAD Patient? No    PAD/SET Patient? No      Pain Assessment   Currently in Pain? No/denies                Social History   Tobacco Use  Smoking Status Never  Smokeless Tobacco Never    Goals Met:  Independence with exercise equipment Exercise tolerated well No report of concerns or symptoms today Strength training completed today  Goals Unmet:  Not Applicable  Comments: Pt able to follow exercise prescription today without complaint.  Will continue to monitor for progression.    Dr. Emily Filbert is Medical Director for [place].  Dr. Ottie Glazier is Medical Director for [facility] Pulmonary Rehabilitation.

## 2021-10-14 DIAGNOSIS — R0609 Other forms of dyspnea: Secondary | ICD-10-CM

## 2021-10-14 NOTE — Progress Notes (Signed)
Daily Session Note  Patient Details  Name: Daniel Vaughan MRN: [id number] Date of Birth: 10-02-1985 Referring Provider:   Flowsheet Row Pulmonary Rehab from 10/07/2021 in [facility] Cardiac and Pulmonary Rehab  Referring Provider Whitson, [place] MD       Encounter Date: 10/14/2021  Check In:  Session Check In - 10/14/21 0929       Check-In   Supervising physician immediately available to respond to emergencies See telemetry face sheet for immediately available ER MD    Location [facility]-Cardiac & Pulmonary Rehab    Staff Present Carson Myrtle, BS, RRT, CPFT;Joseph Karie Fetch, MPA, RN    Virtual Visit No    Medication changes reported     No    Fall or balance concerns reported    No    Warm-up and Cool-down Performed on first and last piece of equipment    Resistance Training Performed Yes    VAD Patient? No    PAD/SET Patient? No      Pain Assessment   Currently in Pain? No/denies                Social History   Tobacco Use  Smoking Status Never  Smokeless Tobacco Never    Goals Met:  Independence with exercise equipment Exercise tolerated well No report of concerns or symptoms today Strength training completed today  Goals Unmet:  Not Applicable  Comments: Pt able to follow exercise prescription today without complaint.  Will continue to monitor for progression.    Dr. Emily Filbert is Medical Director for [place].  Dr. Ottie Glazier is Medical Director for [facility] Pulmonary Rehabilitation.

## 2021-10-18 DIAGNOSIS — R0609 Other forms of dyspnea: Secondary | ICD-10-CM

## 2021-10-21 DIAGNOSIS — R0609 Other forms of dyspnea: Secondary | ICD-10-CM | POA: Diagnosis not present

## 2021-10-21 DIAGNOSIS — Z942 Lung transplant status: Secondary | ICD-10-CM

## 2021-10-21 NOTE — Progress Notes (Signed)
Daily Session Note  Patient Details  Name: Daniel Vaughan MRN: [id number] Date of Birth: Jun 12, 1985 Referring Provider:   Flowsheet Row Pulmonary Rehab from 10/07/2021 in [facility] Cardiac and Pulmonary Rehab  Referring Provider Whitson, [place] MD       Encounter Date: 10/21/2021  Check In:  Session Check In - 10/21/21 1534       Check-In   Supervising physician immediately available to respond to emergencies See telemetry face sheet for immediately available ER MD    Location [facility]-Cardiac & Pulmonary Rehab    Staff Present Antionette Fairy, BS, Exercise Physiologist;Joseph [place], RCP,RRT,BSRT;Jessica [place], MA, RCEP, CCRP, CCET;Susanne Bice, RN, BSN, CCRP    Virtual Visit No    Medication changes reported     Yes    Comments adding metoprolol twice a day    Fall or balance concerns reported    No    Warm-up and Cool-down Performed on first and last piece of equipment    Resistance Training Performed Yes    VAD Patient? No    PAD/SET Patient? No      Pain Assessment   Currently in Pain? No/denies                Social History   Tobacco Use  Smoking Status Never  Smokeless Tobacco Never    Goals Met:  Proper associated with RPD/PD & O2 Sat Independence with exercise equipment Exercise tolerated well No report of concerns or symptoms today Strength training completed today  Goals Unmet:  Not Applicable  Comments: Pt able to follow exercise prescription today without complaint.  Will continue to monitor for progression.   Reviewed home exercise with pt today.  Pt plans to continue to use equipment at home for exercise.  He has a treadmill, ellipitcal, bands, weights, and access to vidoes as well.  Reviewed THR, pulse, RPE, sign and symptoms, pulse oximetery and when to call 911 or MD.  Also discussed weather considerations and indoor options.  Pt voiced understanding.   Dr. Emily Filbert is Medical Director for [place].  Dr. Ottie Glazier is Medical Director for [facility] Pulmonary Rehabilitation.

## 2021-10-22 ENCOUNTER — Encounter: Payer: BC Managed Care – PPO | Admitting: *Deleted

## 2021-10-22 DIAGNOSIS — R0609 Other forms of dyspnea: Secondary | ICD-10-CM

## 2021-10-22 NOTE — Progress Notes (Signed)
Daily Session Note  Patient Details  Name: Daniel Vaughan MRN: [id number] Date of Birth: May 04, 1985 Referring Provider:   Flowsheet Row Pulmonary Rehab from 10/07/2021 in [facility] Cardiac and Pulmonary Rehab  Referring Provider Whitson, [place] MD       Encounter Date: 10/22/2021  Check In:  Session Check In - 10/22/21 0928       Check-In   Supervising physician immediately available to respond to emergencies See telemetry face sheet for immediately available ER MD    Location [facility]-Cardiac & Pulmonary Rehab    Staff Present Heath Lark, RN, BSN, CCRP;Joseph [place], RCP,RRT,BSRT;Jessica [place], [place], RCEP, CCRP, CCET    Virtual Visit No    Medication changes reported     Yes    Comments restarted ferrous sulfate 325 mg.    Fall or balance concerns reported    No    Warm-up and Cool-down Performed on first and last piece of equipment    Resistance Training Performed Yes    VAD Patient? No    PAD/SET Patient? No      Pain Assessment   Currently in Pain? No/denies                Social History   Tobacco Use  Smoking Status Never  Smokeless Tobacco Never    Goals Met:  Proper associated with RPD/PD & O2 Sat Independence with exercise equipment Exercise tolerated well No report of concerns or symptoms today  Goals Unmet:  Not Applicable  Comments: Pt able to follow exercise prescription today without complaint.  Will continue to monitor for progression.    Dr. Emily Filbert is Medical Director for [place].  Dr. Ottie Glazier is Medical Director for [facility] Pulmonary Rehabilitation.

## 2021-10-27 ENCOUNTER — Encounter: Payer: BC Managed Care – PPO | Attending: Student in an Organized Health Care Education/Training Program

## 2021-10-27 DIAGNOSIS — Z942 Lung transplant status: Secondary | ICD-10-CM | POA: Diagnosis not present

## 2021-10-27 DIAGNOSIS — R0609 Other forms of dyspnea: Secondary | ICD-10-CM | POA: Diagnosis present

## 2021-10-27 NOTE — Progress Notes (Signed)
Daily Session Note  Patient Details  Name: Daniel Vaughan MRN: [id number] Date of Birth: 04-14-1986 Referring Provider:   Flowsheet Row Pulmonary Rehab from 10/07/2021 in [facility] Cardiac and Pulmonary Rehab  Referring Provider Whitson, [place] MD       Encounter Date: 10/27/2021  Check In:  Session Check In - 10/27/21 1528       Check-In   Supervising physician immediately available to respond to emergencies See telemetry face sheet for immediately available ER MD    Location [facility]-Cardiac & Pulmonary Rehab    Staff Present Antionette Fairy, BS, Exercise Physiologist;Joseph [place], [place];Heath Lark, RN, BSN, Dorris Singh, MPA, RN    Virtual Visit No    Medication changes reported     No    Fall or balance concerns reported    No    Tobacco Cessation No Change    Warm-up and Cool-down Performed on first and last piece of equipment    Resistance Training Performed Yes    VAD Patient? No    PAD/SET Patient? No      Pain Assessment   Currently in Pain? No/denies    Multiple Pain Sites No                Social History   Tobacco Use  Smoking Status Never  Smokeless Tobacco Never    Goals Met:  Independence with exercise equipment Exercise tolerated well No report of concerns or symptoms today Strength training completed today  Goals Unmet:  Not Applicable  Comments: Pt able to follow exercise prescription today without complaint.  Will continue to monitor for progression.    Dr. Emily Filbert is Medical Director for [place].  Dr. Ottie Glazier is Medical Director for [facility] Pulmonary Rehabilitation.

## 2021-11-03 ENCOUNTER — Encounter: Payer: Self-pay | Admitting: *Deleted

## 2021-11-03 DIAGNOSIS — R0609 Other forms of dyspnea: Secondary | ICD-10-CM

## 2021-11-03 NOTE — Progress Notes (Signed)
Pulmonary Individual Treatment Plan  Patient Details  Name: TAMAR LIPSCOMB MRN: [id number] Date of Birth: March 18, 1986 Referring Provider:   Flowsheet Row Pulmonary Rehab from 10/07/2021 in [facility] Cardiac and Pulmonary Rehab  Referring Provider Whitson, [place] MD       Initial Encounter Date:  Flowsheet Row Pulmonary Rehab from 10/07/2021 in [facility] Cardiac and Pulmonary Rehab  Date 10/07/21       Visit Diagnosis: Dyspnea on exertion  Patient's Home Medications on Admission:  Current Outpatient Medications:    acetaminophen (TYLENOL) 500 MG tablet, Take by mouth., Disp: , Rfl:    albuterol (PROVENTIL) (2.5 MG/3ML) 0.083% nebulizer solution, Inhale into the lungs., Disp: , Rfl:    albuterol (VENTOLIN HFA) 108 (90 Base) MCG/ACT inhaler, Inhale into the lungs., Disp: , Rfl:    azithromycin (ZITHROMAX) 250 MG tablet, TAKE 1 TABLET BY MOUTH ONCE DAILY FOR 90DAYS, Disp: , Rfl:    beclomethasone (QVAR) 80 MCG/ACT inhaler, Inhale into the lungs., Disp: , Rfl:    Belumosudil Mesylate 200 MG TABS, Take by mouth., Disp: , Rfl:    budesonide (ENTOCORT EC) 3 MG 24 hr capsule, Take by mouth., Disp: , Rfl:    butalbital-acetaminophen-caffeine (FIORICET) 50-325-40 MG tablet, Take by mouth., Disp: , Rfl:    cetirizine (ZYRTEC) 10 MG tablet, Take by mouth., Disp: , Rfl:    Cholecalciferol 50 MCG (2000 UT) CAPS, Take by mouth., Disp: , Rfl:    Cyanocobalamin (VITAMIN B 12) 100 MCG LOZG, Take 1 tablet by mouth daily., Disp: , Rfl:    ethambutol (MYAMBUTOL) 400 MG tablet, Take by mouth., Disp: , Rfl:    ferrous sulfate 325 (65 FE) MG tablet, Take 325 mg by mouth daily with breakfast., Disp: , Rfl:    Immune Globulin 10% (GAMUNEX-C) 10 GM/100ML SOLN, Inject into the vein., Disp: , Rfl:    latanoprost (XALATAN) 0.005 % ophthalmic solution, Apply to eye., Disp: , Rfl:    levothyroxine (SYNTHROID) 75 MCG tablet, Take by mouth., Disp: , Rfl:    lidocaine 23% - tetracaine 7% topical ointment, plasticized,,  Apply topically., Disp: , Rfl:    Magnesium Bisglycinate (MAG GLYCINATE) 100 MG TABS, Take by mouth., Disp: , Rfl:    metoprolol tartrate (LOPRESSOR) 25 MG tablet, Take by mouth., Disp: , Rfl:    mirtazapine (REMERON) 7.5 MG tablet, Take by mouth., Disp: , Rfl:    montelukast (SINGULAIR) 10 MG tablet, Take 1 tablet by mouth at bedtime., Disp: , Rfl:    moxifloxacin (AVELOX) 400 MG tablet, Take 1 tablet by mouth daily., Disp: , Rfl:    niacinamide 500 MG tablet, Take by mouth., Disp: , Rfl:    ondansetron (ZOFRAN) 4 MG tablet, Take 4 mg by mouth every 8 (eight) hours as needed., Disp: , Rfl:    prochlorperazine (COMPAZINE) 10 MG tablet, Take by mouth., Disp: , Rfl:    simethicone (MYLICON) 80 MG chewable tablet, Chew by mouth., Disp: , Rfl:    sodium bicarbonate 650 MG tablet, Take by mouth., Disp: , Rfl:    tacrolimus (PROGRAF) 0.5 MG capsule, Take 0.'5mg'$  by mouth every 48 hours; take in the AM, Disp: , Rfl:    tretinoin (RETIN-A) 0.025 % cream, Apply a thin layer (pea sized amount) to sun damaged areas of scalp and face at night. Wash off in the morning and apply moisturizer and sunscreen., Disp: , Rfl:    valGANciclovir (VALCYTE) 450 MG tablet, Take by mouth., Disp: , Rfl:   Past Medical History: No past  medical history on file.  Tobacco Use: Social History   Tobacco Use  Smoking Status Never  Smokeless Tobacco Never    Labs: Review Flowsheet        No data to display           Pulmonary Assessment Scores:  Pulmonary Assessment Scores     Row Name 10/07/21 1606         ADL UCSD   ADL Phase Entry     SOB Score total 81     Rest 1     Walk 3     Stairs 5     Bath 3     Dress 3     Shop 3       CAT Score   CAT Score 29       mMRC Score   mMRC Score 3              UCSD: Self-administered rating of dyspnea associated with activities of daily living (ADLs) 6-point scale (0 = "not at all" to 5 = "maximal or unable to do because of breathlessness")   Scoring Scores range from 0 to 120.  Minimally important difference is 5 units  CAT: CAT can identify the health impairment of COPD patients and is better correlated with disease progression.  CAT has a scoring range of zero to 40. The CAT score is classified into four groups of low (less than 10), medium (10 - 20), high (21-30) and very high (31-40) based on the impact level of disease on health status. A CAT score over 10 suggests significant symptoms.  A worsening CAT score could be explained by an exacerbation, poor medication adherence, poor inhaler technique, or progression of COPD or comorbid conditions.  CAT MCID is 2 points  mMRC: mMRC (Modified Medical Research Council) Dyspnea Scale is used to assess the degree of baseline functional disability in patients of respiratory disease due to dyspnea. No minimal important difference is established. A decrease in score of 1 point or greater is considered a positive change.   Pulmonary Function Assessment:   Exercise Target Goals: Exercise Program Goal: Individual exercise prescription set using results from initial 6 min walk test and THRR while considering  patient's activity barriers and safety.   Exercise Prescription Goal: Initial exercise prescription builds to 30-45 minutes a day of aerobic activity, 2-3 days per week.  Home exercise guidelines will be given to patient during program as part of exercise prescription that the participant will acknowledge.  Education: Aerobic Exercise: - Group verbal and visual presentation on the components of exercise prescription. Introduces F.I.T.T principle from ACSM for exercise prescriptions.  Reviews F.I.T.T. principles of aerobic exercise including progression. Written material given at graduation.   Education: Resistance Exercise: - Group verbal and visual presentation on the components of exercise prescription. Introduces F.I.T.T principle from ACSM for exercise prescriptions  Reviews  F.I.T.T. principles of resistance exercise including progression. Written material given at graduation.    Education: Exercise & Equipment Safety: - Individual verbal instruction and demonstration of equipment use and safety with use of the equipment. Flowsheet Row Pulmonary Rehab from 10/13/2021 in [facility] Cardiac and Pulmonary Rehab  Date 10/07/21  Educator [facility]  Instruction Review Code 1- Verbalizes Understanding       Education: Exercise Physiology & General Exercise Guidelines: - Group verbal and written instruction with models to review the exercise physiology of the cardiovascular system and associated critical values. Provides general exercise guidelines with specific guidelines to those  with heart or lung disease.    Education: Flexibility, Balance, Mind/Body Relaxation: - Group verbal and visual presentation with interactive activity on the components of exercise prescription. Introduces F.I.T.T principle from ACSM for exercise prescriptions. Reviews F.I.T.T. principles of flexibility and balance exercise training including progression. Also discusses the mind body connection.  Reviews various relaxation techniques to help reduce and manage stress (i.e. Deep breathing, progressive muscle relaxation, and visualization). Balance handout provided to take home. Written material given at graduation.   Activity Barriers & Risk Stratification:  Activity Barriers & Cardiac Risk Stratification - 10/07/21 1202       Activity Barriers & Cardiac Risk Stratification   Activity Barriers Deconditioning;Muscular Weakness;Shortness of Breath             6 Minute Walk:  6 Minute Walk     Row Name 10/07/21 1540         6 Minute Walk   Phase Initial     Distance 1210 feet     Walk Time 6 minutes     # of Rest Breaks 0     MPH 2.27     METS 6.02     RPE 15     Perceived Dyspnea  4     VO2 Peak 21.08     Symptoms Yes (comment)     Comments SOB     Resting HR 108 bpm     Resting BP  122/60     Resting Oxygen Saturation  96 %     Exercise Oxygen Saturation  during 6 min walk 92 %     Max Ex. HR 137 bpm     Max Ex. BP 156/74     2 Minute Post BP 142/72             Oxygen Initial Assessment:  Oxygen Initial Assessment - 10/07/21 1207       Home Oxygen   Home Oxygen Device None    Sleep Oxygen Prescription None    Home Exercise Oxygen Prescription None    Home Resting Oxygen Prescription None    Compliance with Home Oxygen Use Yes      Initial 6 min Walk   Oxygen Used None      Program Oxygen Prescription   Program Oxygen Prescription None      Intervention   Short Term Goals To learn and understand importance of monitoring SPO2 with pulse oximeter and demonstrate accurate use of the pulse oximeter.;To learn and understand importance of maintaining oxygen saturations>88%;To learn and demonstrate proper pursed lip breathing techniques or other breathing techniques. ;To learn and demonstrate proper use of respiratory medications    Long  Term Goals Verbalizes importance of monitoring SPO2 with pulse oximeter and return demonstration;Maintenance of O2 saturations>88%;Exhibits proper breathing techniques, such as pursed lip breathing or other method taught during program session;Compliance with respiratory medication;Demonstrates proper use of MDI's             Oxygen Re-Evaluation:  Oxygen Re-Evaluation     Row Name 10/08/21 1010 10/21/21 1600           Program Oxygen Prescription   Program Oxygen Prescription None None        Home Oxygen   Home Oxygen Device None None      Sleep Oxygen Prescription None None      Home Exercise Oxygen Prescription None None      Home Resting Oxygen Prescription -- None      Compliance with Home Oxygen  Use Yes Yes        Goals/Expected Outcomes   Short Term Goals To learn and demonstrate proper pursed lip breathing techniques or other breathing techniques.  To learn and demonstrate proper pursed lip breathing  techniques or other breathing techniques. ;To learn and understand importance of monitoring SPO2 with pulse oximeter and demonstrate accurate use of the pulse oximeter.;To learn and understand importance of maintaining oxygen saturations>88%;To learn and demonstrate proper use of respiratory medications      Long  Term Goals Exhibits proper breathing techniques, such as pursed lip breathing or other method taught during program session Verbalizes importance of monitoring SPO2 with pulse oximeter and return demonstration;Maintenance of O2 saturations>88%;Exhibits proper breathing techniques, such as pursed lip breathing or other method taught during program session;Compliance with respiratory medication;Demonstrates proper use of MDI's      Comments Reviewed PLB technique with pt.  Talked about how it works and it's importance in maintaining their exercise saturations. Catalina Antigua has spent the day a [facility] with workups.  They are going to do an ABG on Monday morning before his treatment to see if he is retaining CO2.  If he is, he will qualify for a BiPap or Trillgy to help him breathe and sleep better.  His sats continue to stay above 92% all day.  He is good about doing his treatments and using his meds.  He uses PLB consistently.      Goals/Expected Outcomes Short: Become more profiecient at using PLB.   Long: Become independent at using PLB. Short: Find out about CO2 Long; Conitnue to use PLB routinely               Oxygen Discharge (Final Oxygen Re-Evaluation):  Oxygen Re-Evaluation - 10/21/21 1600       Program Oxygen Prescription   Program Oxygen Prescription None      Home Oxygen   Home Oxygen Device None    Sleep Oxygen Prescription None    Home Exercise Oxygen Prescription None    Home Resting Oxygen Prescription None    Compliance with Home Oxygen Use Yes      Goals/Expected Outcomes   Short Term Goals To learn and demonstrate proper pursed lip breathing techniques or other breathing  techniques. ;To learn and understand importance of monitoring SPO2 with pulse oximeter and demonstrate accurate use of the pulse oximeter.;To learn and understand importance of maintaining oxygen saturations>88%;To learn and demonstrate proper use of respiratory medications    Long  Term Goals Verbalizes importance of monitoring SPO2 with pulse oximeter and return demonstration;Maintenance of O2 saturations>88%;Exhibits proper breathing techniques, such as pursed lip breathing or other method taught during program session;Compliance with respiratory medication;Demonstrates proper use of MDI's    Comments Catalina Antigua has spent the day a [facility] with workups.  They are going to do an ABG on Monday morning before his treatment to see if he is retaining CO2.  If he is, he will qualify for a BiPap or Trillgy to help him breathe and sleep better.  His sats continue to stay above 92% all day.  He is good about doing his treatments and using his meds.  He uses PLB consistently.    Goals/Expected Outcomes Short: Find out about CO2 Long; Conitnue to use PLB routinely             Initial Exercise Prescription:  Initial Exercise Prescription - 10/07/21 1500       Date of Initial Exercise RX and Referring Provider   Date 10/07/21  Referring Provider Whitson, [place] MD      Oxygen   Maintain Oxygen Saturation 88% or higher      Treadmill   MPH 2    Grade 0.5    Minutes 15    METs 2.67      Recumbant Bike   Level 2    RPM 50    Watts 10    Minutes 15    METs 2      NuStep   Level 4    SPM 80    Minutes 15    METs 2.5      REL-XR   Level 2    Speed 50    Minutes 15    METs 2.5      Track   Laps 29    Minutes 15    METs 2.58      Prescription Details   Frequency (times per week) 3    Duration Progress to 30 minutes of continuous aerobic without signs/symptoms of physical distress      Intensity   THRR 40-80% of Max Heartrate 139-170    Ratings of Perceived Exertion 11-13     Perceived Dyspnea 0-4      Progression   Progression Continue to progress workloads to maintain intensity without signs/symptoms of physical distress.      Resistance Training   Training Prescription Yes    Weight 4 lb    Reps 10-15             Perform Capillary Blood Glucose checks as needed.  Exercise Prescription Changes:   Exercise Prescription Changes     Row Name 10/07/21 1500 10/13/21 1000 10/21/21 1500 10/27/21 0700       Response to Exercise   Blood Pressure (Admit) 122/60 126/62 -- 102/68    Blood Pressure (Exercise) 156/74 142/70 -- 140/78    Blood Pressure (Exit) 124/70 120/66 -- 102/60    Heart Rate (Admit) 108 bpm 97 bpm -- 102 bpm    Heart Rate (Exercise) 137 bpm 111 bpm -- 131 bpm    Heart Rate (Exit) 118 bpm 105 bpm -- 109 bpm    Oxygen Saturation (Admit) 96 % 96 % -- 95 %    Oxygen Saturation (Exercise) 92 % 92 % -- 91 %    Oxygen Saturation (Exit) 97 % 95 % -- 96 %    Rating of Perceived Exertion (Exercise) 15 15 -- 17    Perceived Dyspnea (Exercise) 4 3 -- 3    Symptoms SOB SOB -- SOB    Comments walk test results 1st full day of exercise -- --    Duration -- Progress to 30 minutes of  aerobic without signs/symptoms of physical distress -- Progress to 30 minutes of  aerobic without signs/symptoms of physical distress    Intensity -- THRR unchanged -- THRR unchanged      Progression   Progression -- Continue to progress workloads to maintain intensity without signs/symptoms of physical distress. -- Continue to progress workloads to maintain intensity without signs/symptoms of physical distress.    Average METs -- 2.88 -- 2.31      Resistance Training   Training Prescription -- Yes -- Yes    Weight -- 4 lb -- 4 lb    Reps -- 10-15 -- 10-15      Interval Training   Interval Training -- No -- No      Recumbant Bike   Level -- -- -- 1    Watts -- -- --  15    Minutes -- -- -- 15    METs -- -- -- 2.59      NuStep   Level -- -- -- 4    Minutes  -- -- -- 15    METs -- -- -- 2.2      REL-XR   Level -- 2 -- 2    Minutes -- 15 -- 30    METs -- 2.7 -- 1.7      Biostep-RELP   Level -- -- -- 1    Minutes -- -- -- 15    METs -- -- -- 2      Track   Laps -- 38 -- 38    Minutes -- 2 -- 15    METs -- 3.07 -- 3.07      Home Exercise Plan   Plans to continue exercise at -- -- Home (comment)  walking, treadmill, elliptical, weights, bands Home (comment)  walking, treadmill, elliptical, weights, bands    Frequency -- -- Add 2 additional days to program exercise sessions. Add 2 additional days to program exercise sessions.    Initial Home Exercises Provided -- -- 10/21/21 10/21/21      Oxygen   Maintain Oxygen Saturation -- 88% or higher -- 88% or higher             Exercise Comments:   Exercise Comments     Row Name 10/08/21 1009           Exercise Comments First full day of exercise!  Patient was oriented to gym and equipment including functions, settings, policies, and procedures.  Patient's individual exercise prescription and treatment plan were reviewed.  All starting workloads were established based on the results of the 6 minute walk test done at initial orientation visit.  The plan for exercise progression was also introduced and progression will be customized based on patient's performance and goals.                Exercise Goals and Review:   Exercise Goals     Row Name 10/07/21 1548             Exercise Goals   Increase Physical Activity Yes       Intervention Provide advice, education, support and counseling about physical activity/exercise needs.;Develop an individualized exercise prescription for aerobic and resistive training based on initial evaluation findings, risk stratification, comorbidities and participant's personal goals.       Expected Outcomes Short Term: Attend rehab on a regular basis to increase amount of physical activity.;Long Term: Add in home exercise to make exercise part of  routine and to increase amount of physical activity.;Long Term: Exercising regularly at least 3-5 days a week.       Increase Strength and Stamina Yes       Intervention Provide advice, education, support and counseling about physical activity/exercise needs.;Develop an individualized exercise prescription for aerobic and resistive training based on initial evaluation findings, risk stratification, comorbidities and participant's personal goals.       Expected Outcomes Short Term: Increase workloads from initial exercise prescription for resistance, speed, and METs.;Short Term: Perform resistance training exercises routinely during rehab and add in resistance training at home;Long Term: Improve cardiorespiratory fitness, muscular endurance and strength as measured by increased METs and functional capacity (6MWT)       Able to understand and use rate of perceived exertion (RPE) scale Yes       Intervention Provide education and explanation on how to  use RPE scale       Expected Outcomes Long Term:  Able to use RPE to guide intensity level when exercising independently;Short Term: Able to use RPE daily in rehab to express subjective intensity level       Able to understand and use Dyspnea scale Yes       Intervention Provide education and explanation on how to use Dyspnea scale       Expected Outcomes Short Term: Able to use Dyspnea scale daily in rehab to express subjective sense of shortness of breath during exertion;Long Term: Able to use Dyspnea scale to guide intensity level when exercising independently       Knowledge and understanding of Target Heart Rate Range (THRR) Yes       Intervention Provide education and explanation of THRR including how the numbers were predicted and where they are located for reference       Expected Outcomes Short Term: Able to state/look up THRR;Short Term: Able to use daily as guideline for intensity in rehab;Long Term: Able to use THRR to govern intensity when  exercising independently       Able to check pulse independently Yes       Intervention Provide education and demonstration on how to check pulse in carotid and radial arteries.;Review the importance of being able to check your own pulse for safety during independent exercise       Expected Outcomes Short Term: Able to explain why pulse checking is important during independent exercise;Long Term: Able to check pulse independently and accurately       Understanding of Exercise Prescription Yes       Intervention Provide education, explanation, and written materials on patient's individual exercise prescription       Expected Outcomes Short Term: Able to explain program exercise prescription;Long Term: Able to explain home exercise prescription to exercise independently                Exercise Goals Re-Evaluation :  Exercise Goals Re-Evaluation     Row Name 10/08/21 1010 10/13/21 1027 10/21/21 1554 10/27/21 0743       Exercise Goal Re-Evaluation   Exercise Goals Review Able to understand and use rate of perceived exertion (RPE) scale;Knowledge and understanding of Target Heart Rate Range (THRR);Able to understand and use Dyspnea scale;Understanding of Exercise Prescription Increase Physical Activity;Increase Strength and Stamina;Understanding of Exercise Prescription Increase Physical Activity;Increase Strength and Stamina;Understanding of Exercise Prescription;Able to understand and use rate of perceived exertion (RPE) scale;Able to understand and use Dyspnea scale;Knowledge and understanding of Target Heart Rate Range (THRR);Able to check pulse independently Increase Physical Activity;Increase Strength and Stamina;Understanding of Exercise Prescription    Comments Reviewed RPE and dyspnea scales, THR and program prescription with pt today.  Pt voiced understanding and was given a copy of goals to take home. Catalina Antigua is doing well for his first session that he was in rehab. He was able to exercies  38 laps on the track and oxygen stayed bove 88%. Will continue to monitor. Reviewed home exercise with pt today.  Pt plans to continue to use equipment at home for exercise.  He has a treadmill, ellipitcal, bands, weights, and access to vidoes as well.  Reviewed THR, pulse, RPE, sign and symptoms, pulse oximetery and when to call 911 or MD.  Also discussed weather considerations and indoor options.  Pt voiced understanding. Eshan is doing well in rehab. He improved to 38 laps while walking on the track. He also increased to  level 2 on the T4. He has tolerated using 4 lb hand weights for resistance training as well. We will continue to monitor his progress in the program.    Expected Outcomes Short: Use RPE daily to regulate intensity. Long: Follow program prescription in THR. Short: Continue to follow current exercise prescription Long: Increase overall strength and stamina Short: Start to add in more cardio at home Long: Continue to improve stamina Short: try level 2 on the Recumbent Bike. Long: Continue to increase strength and stamina.             Discharge Exercise Prescription (Final Exercise Prescription Changes):  Exercise Prescription Changes - 10/27/21 0700       Response to Exercise   Blood Pressure (Admit) 102/68    Blood Pressure (Exercise) 140/78    Blood Pressure (Exit) 102/60    Heart Rate (Admit) 102 bpm    Heart Rate (Exercise) 131 bpm    Heart Rate (Exit) 109 bpm    Oxygen Saturation (Admit) 95 %    Oxygen Saturation (Exercise) 91 %    Oxygen Saturation (Exit) 96 %    Rating of Perceived Exertion (Exercise) 17    Perceived Dyspnea (Exercise) 3    Symptoms SOB    Duration Progress to 30 minutes of  aerobic without signs/symptoms of physical distress    Intensity THRR unchanged      Progression   Progression Continue to progress workloads to maintain intensity without signs/symptoms of physical distress.    Average METs 2.31      Resistance Training   Training  Prescription Yes    Weight 4 lb    Reps 10-15      Interval Training   Interval Training No      Recumbant Bike   Level 1    Watts 15    Minutes 15    METs 2.59      NuStep   Level 4    Minutes 15    METs 2.2      REL-XR   Level 2    Minutes 30    METs 1.7      Biostep-RELP   Level 1    Minutes 15    METs 2      Track   Laps 38    Minutes 15    METs 3.07      Home Exercise Plan   Plans to continue exercise at Home (comment)   walking, treadmill, elliptical, weights, bands   Frequency Add 2 additional days to program exercise sessions.    Initial Home Exercises Provided 10/21/21      Oxygen   Maintain Oxygen Saturation 88% or higher             Nutrition:  Target Goals: Understanding of nutrition guidelines, daily intake of sodium '1500mg'$ , cholesterol '200mg'$ , calories 30% from fat and 7% or less from saturated fats, daily to have 5 or more servings of fruits and vegetables.  Education: All About Nutrition: -Group instruction provided by verbal, written material, interactive activities, discussions, models, and posters to present general guidelines for heart healthy nutrition including fat, fiber, MyPlate, the role of sodium in heart healthy nutrition, utilization of the nutrition label, and utilization of this knowledge for meal planning. Follow up email sent as well. Written material given at graduation.   Biometrics:  Pre Biometrics - 10/07/21 1548       Pre Biometrics   Height '5\' 11"'$  (1.803 m)    Weight 119  lb (54 kg)    BMI (Calculated) 16.6    Single Leg Stand 30 seconds              Nutrition Therapy Plan and Nutrition Goals:  Nutrition Therapy & Goals - 10/07/21 1207       Intervention Plan   Intervention Prescribe, educate and counsel regarding individualized specific dietary modifications aiming towards targeted core components such as weight, hypertension, lipid management, diabetes, heart failure and other comorbidities.     Expected Outcomes Short Term Goal: Understand basic principles of dietary content, such as calories, fat, sodium, cholesterol and nutrients.;Short Term Goal: A plan has been developed with personal nutrition goals set during dietitian appointment.;Long Term Goal: Adherence to prescribed nutrition plan.             Nutrition Assessments:  MEDIFICTS Score Key: ?70 Need to make dietary changes  40-70 Heart Healthy Diet ? 40 Therapeutic Level Cholesterol Diet  Flowsheet Row Pulmonary Rehab from 10/07/2021 in [facility] Cardiac and Pulmonary Rehab  Picture Your Plate Total Score on Admission 38      Picture Your Plate Scores: <29 Unhealthy dietary pattern with much room for improvement. 41-50 Dietary pattern unlikely to meet recommendations for good health and room for improvement. 51-60 More healthful dietary pattern, with some room for improvement.  >60 Healthy dietary pattern, although there may be some specific behaviors that could be improved.   Nutrition Goals Re-Evaluation:  Nutrition Goals Re-Evaluation     [place] Name 10/21/21 1604             Goals   Nutrition Goal Meet with dieititian       Comment Matt missed his appointment he would like to aim for 7/12 in person. Note will be left with dietitian to schedule and his wife would like to attend as well.       Expected Outcome Meet with dietitian                Nutrition Goals Discharge (Final Nutrition Goals Re-Evaluation):  Nutrition Goals Re-Evaluation - 10/21/21 1604       Goals   Nutrition Goal Meet with dieititian    Comment Matt missed his appointment he would like to aim for 7/12 in person. Note will be left with dietitian to schedule and his wife would like to attend as well.    Expected Outcome Meet with dietitian             Psychosocial: Target Goals: Acknowledge presence or absence of significant depression and/or stress, maximize coping skills, provide positive support system. Participant is able  to verbalize types and ability to use techniques and skills needed for reducing stress and depression.   Education: Stress, Anxiety, and Depression - Group verbal and visual presentation to define topics covered.  Reviews how body is impacted by stress, anxiety, and depression.  Also discusses healthy ways to reduce stress and to treat/manage anxiety and depression.  Written material given at graduation. Flowsheet Row Pulmonary Rehab from 10/13/2021 in [facility] Cardiac and Pulmonary Rehab  Date 10/13/21  Educator [facility]  Instruction Review Code 1- [employer] Understanding       Education: Sleep Hygiene -Provides group verbal and written instruction about how sleep can affect your health.  Define sleep hygiene, discuss sleep cycles and impact of sleep habits. Review good sleep hygiene tips.    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 Yes   wife, mom comes to help as well     Barriers   Psychosocial barriers to participate in program Psychosocial barriers identified (see note);The patient should benefit from training in stress management and relaxation.      Screening Interventions   Interventions Encouraged to exercise;To provide support and resources with identified psychosocial needs;Provide feedback about the scores to participant    Expected Outcomes Short Term goal: Utilizing psychosocial counselor, staff and physician to assist with identification of specific Stressors or current issues interfering with healing process. Setting desired goal for each stressor or current issue identified.;Long Term Goal:  Stressors or current issues are controlled or eliminated.;Short Term goal: Identification and review with participant of any Quality of Life or Depression concerns found by scoring the questionnaire.;Long Term goal: The participant improves quality of Life and PHQ9 Scores as seen by post scores and/or verbalization of changes             Quality of Life Scores:  Scores of 19 and below usually indicate a poorer quality of life in these areas.  A difference of  2-3 points is a clinically meaningful difference.  A difference of 2-3 points in the total score of the Quality of Life Index has been associated with significant improvement in overall quality of life, self-image, physical symptoms, and general health in studies assessing change in quality of life.  PHQ-9: Review Flowsheet       10/21/2021 10/07/2021  Depression screen PHQ 2/9  Decreased Interest 0 1  Down, Depressed, Hopeless 1 1  PHQ - 2 Score 1 2  Altered sleeping 1 1  Tired, decreased energy 3 3  Change in appetite 1 2  Feeling bad or failure about yourself  0 1  Trouble concentrating 1 1  Moving slowly or fidgety/restless 3 1  Suicidal thoughts 0 0  PHQ-9 Score 10 11  Difficult doing work/chores Somewhat difficult Somewhat difficult   Interpretation of Total Score  Total Score Depression Severity:  1-4 = Minimal depression, 5-9 = Mild depression, 10-14 = Moderate depression, 15-19 = Moderately severe depression, 20-27 = Severe depression   Psychosocial Evaluation and Intervention:  Psychosocial Evaluation - 10/07/21 1549       Psychosocial Evaluation & Interventions   Interventions Encouraged to exercise with the program and follow exercise prescription;Stress management education;Relaxation education    Comments Catalina Antigua is coming into pulmonary rehab for dsypnea on exertion, s/p bilateral lung transplant.  Since his transplant in 2017 he has had two bouts of COVID which both seemed mild (not requiring hospitalization).   He started to notice just before COVID that he was starting to have difficulty with doing yard work and getting short of breath.  It has continued to worsen and he was worked up and felt to be fighting a fungal infection as well.  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  He is getting [facility] at [facility] currently.  He is working on his Brewing technologist in Investment banker, operational at [employer].  He is also working, but out on [employer] for rehab.  He has a great support system in his wife and mom.  They will be bringing him to classes each day to help as well.  He does not currently have any financial barriers to rehab. He really wants to work on gaining muscle and strength back.  He wants to have the stamina to be able to just go without getting out of breath and keep up with his wife.    Expected Outcomes Short: Build muscle Long: Improve stamina    Continue Psychosocial Services  Follow up required by staff             Psychosocial Re-Evaluation:  Psychosocial Re-Evaluation     [place] Name 10/21/21 1611             Psychosocial Re-Evaluation   Current issues with Current Anxiety/Panic;Current Stress Concerns;Current Sleep Concerns       Comments Catalina Antigua is doing well in rehab. Reviewd PHQ and he is down 1 point over last two weeks.  He is moving more, but losing weight.  He continues to struggle with sleep as he wakes up at 5am every day, then sleepy all day.  His health continues to be a stressor with all of his appointments and not being able to tolerate eating more to gain weight and energy. He has an appointment on Monday for ABGs to see if he can get a BiPap.  He is also still taking courses for his Master's and in the middle of overlap for summer sessions.  He is staying busy.       Expected Outcomes Short: Continue to focus on positives daily Long: COnitnue to work on [employer]       Interventions Encouraged to attend Pulmonary Rehabilitation for the  exercise;Stress management education       Continue Psychosocial Services  Follow up required by staff                Psychosocial Discharge (Final Psychosocial Re-Evaluation):  Psychosocial Re-Evaluation - 10/21/21 1611       Psychosocial Re-Evaluation   Current issues with Current Anxiety/Panic;Current Stress Concerns;Current Sleep Concerns    Comments Catalina Antigua is doing well in rehab. Reviewd PHQ and he is down 1 point over last two weeks.  He is moving more, but losing weight.  He continues to struggle with sleep as he wakes up at 5am every day, then sleepy all day.  His health continues to be a stressor with all of his appointments and not being able to tolerate eating more to gain weight and energy. He has an appointment on Monday for ABGs to see if he can get a BiPap.  He is also still taking courses for his Master's and in the middle of overlap for summer sessions.  He is staying busy.    Expected Outcomes Short: Continue to focus on positives daily Long: COnitnue to work on [employer]    Interventions Encouraged to attend Pulmonary Rehabilitation for the exercise;Stress management education    Continue Psychosocial Services  Follow up required by staff             Education: Education Goals: Education classes will be provided on a weekly basis, covering required topics. Participant will state understanding/return demonstration of topics presented.  Learning Barriers/Preferences:  Learning Barriers/Preferences - 10/07/21 1207       Learning Barriers/Preferences   Learning Barriers Sight   glasses   Learning Preferences Verbal Instruction;Written Material             General Pulmonary Education Topics:  Infection Prevention: - Provides verbal and written material  to individual with discussion of infection control including proper hand washing and proper equipment cleaning during exercise session. Flowsheet Row Pulmonary Rehab from 10/13/2021 in [facility] Cardiac and Pulmonary  Rehab  Date 10/07/21  Educator [facility]  Instruction Review Code 1- Verbalizes Understanding       Falls Prevention: - Provides verbal and written material to individual with discussion of falls prevention and safety. Flowsheet Row Pulmonary Rehab from 10/13/2021 in [facility] Cardiac and Pulmonary Rehab  Date 10/07/21  Educator [facility]  Instruction Review Code 1- Verbalizes Understanding       Chronic Lung Disease Review: - Group verbal instruction with posters, models, PowerPoint presentations and videos,  to review new updates, new respiratory medications, new advancements in procedures and treatments. Providing information on websites and "800" numbers for continued self-education. Includes information about supplement oxygen, available portable oxygen systems, continuous and intermittent flow rates, oxygen safety, concentrators, and Medicare reimbursement for oxygen. Explanation of Pulmonary Drugs, including class, frequency, complications, importance of spacers, rinsing mouth after steroid MDI's, and proper cleaning methods for nebulizers. Review of basic lung anatomy and physiology related to function, structure, and complications of lung disease. Review of risk factors. Discussion about methods for diagnosing sleep apnea and types of masks and machines for OSA. Includes a review of the use of types of environmental controls: home humidity, furnaces, filters, dust mite/pet prevention, HEPA vacuums. Discussion about weather changes, air quality and the benefits of nasal washing. Instruction on Warning signs, infection symptoms, calling MD promptly, preventive modes, and value of vaccinations. Review of effective airway clearance, coughing and/or vibration techniques. Emphasizing that all should Create an Action Plan. Written material given at graduation. Flowsheet Row Pulmonary Rehab from 10/13/2021 in [facility] Cardiac and Pulmonary Rehab  Education need identified 10/07/21       AED/CPR: - Group verbal  and written instruction with the use of models to demonstrate the basic use of the AED with the basic ABC's of resuscitation.    Anatomy and Cardiac Procedures: - Group verbal and visual presentation and models provide information about basic cardiac anatomy and function. Reviews the testing methods done to diagnose heart disease and the outcomes of the test results. Describes the treatment choices: Medical Management, Angioplasty, or Coronary Bypass Surgery for treating various heart conditions including Myocardial Infarction, Angina, Valve Disease, and Cardiac Arrhythmias.  Written material given at graduation.   Medication Safety: - Group verbal and visual instruction to review commonly prescribed medications for heart and lung disease. Reviews the medication, class of the drug, and side effects. Includes the steps to properly store meds and maintain the prescription regimen.  Written material given at graduation.   Other: -Provides group and verbal instruction on various topics (see comments)   Knowledge Questionnaire Score:  Knowledge Questionnaire Score - 10/07/21 1605       Knowledge Questionnaire Score   Pre Score 16/18              Core Components/Risk Factors/Patient Goals at Admission:  Personal Goals and Risk Factors at Admission - 10/07/21 1605       Core Components/Risk Factors/Patient Goals on Admission    Weight Management Yes;Weight Gain    Intervention Weight Management: Develop a combined nutrition and exercise program designed to reach desired caloric intake, while maintaining appropriate intake of nutrient and fiber, sodium and fats, and appropriate energy expenditure required for the weight goal.;Weight Management: Provide education and appropriate resources to help participant work on and attain dietary goals.    Admit Weight  119 lb (54 kg)    Goal Weight: Short Term 125 lb (56.7 kg)    Goal Weight: Long Term 130 lb (59 kg)    Expected Outcomes Short  Term: Continue to assess and modify interventions until short term weight is achieved;Long Term: Adherence to nutrition and physical activity/exercise program aimed toward attainment of established weight goal;Weight Gain: Understanding of general recommendations for a high calorie, high protein meal plan that promotes weight gain by distributing calorie intake throughout the day with the consumption for 4-5 meals, snacks, and/or supplements    Improve shortness of breath with ADL's Yes    Intervention Provide education, individualized exercise plan and daily activity instruction to help decrease symptoms of SOB with activities of daily living.    Expected Outcomes Long Term: Be able to perform more ADLs without symptoms or delay the onset of symptoms;Short Term: Improve cardiorespiratory fitness to achieve a reduction of symptoms when performing ADLs    Increase knowledge of respiratory medications and ability to use respiratory devices properly  Yes    Intervention Provide education and demonstration as needed of appropriate use of medications, inhalers, and oxygen therapy.    Expected Outcomes Long Term: Maintain appropriate use of medications, inhalers, and oxygen therapy.;Short Term: Achieves understanding of medications use. Understands that oxygen is a medication prescribed by physician. Demonstrates appropriate use of inhaler and oxygen therapy.    Hypertension Yes    Intervention Provide education on lifestyle modifcations including regular physical activity/exercise, weight management, moderate sodium restriction and increased consumption of fresh fruit, vegetables, and low fat dairy, alcohol moderation, and smoking cessation.;Monitor prescription use compliance.    Expected Outcomes Short Term: Continued assessment and intervention until BP is < 140/76m HG in hypertensive participants. < 130/860mHG in hypertensive participants with diabetes, heart failure or chronic kidney disease.;Long Term:  Maintenance of blood pressure at goal levels.             Education:Diabetes - Individual verbal and written instruction to review signs/symptoms of diabetes, desired ranges of glucose level fasting, after meals and with exercise. Acknowledge that pre and post exercise glucose checks will be done for 3 sessions at entry of program.   Know Your Numbers and Heart Failure: - Group verbal and visual instruction to discuss disease risk factors for cardiac and pulmonary disease and treatment options.  Reviews associated critical values for Overweight/Obesity, Hypertension, Cholesterol, and Diabetes.  Discusses basics of heart failure: signs/symptoms and treatments.  Introduces Heart Failure Zone chart for action plan for heart failure.  Written material given at graduation.   Core Components/Risk Factors/Patient Goals Review:   Goals and Risk Factor Review     Row Name 10/21/21 1605             Core Components/Risk Factors/Patient Goals Review   Personal Goals Review Weight Management/Obesity;Improve shortness of breath with ADL's;Hypertension       Review MaCatalina Antiguas doing well in rehab.  His weight is down some with the added cardio.  If he eats before coming, his heart rate gets too high for him to tolerate exercise.  He is planning to meet with dietitian as he had to reschedule his appointment.  His blood pressures are running high at home but on the lower side here in class.  He is using his PLB to help with ADLs but his heart rate gets away from him and he needs to rest to catch his breath.       Expected Outcomes Short: Gain  weight and meet with dietitian Long: COntinue to monitor pressures closely                Core Components/Risk Factors/Patient Goals at Discharge (Final Review):   Goals and Risk Factor Review - 10/21/21 1605       Core Components/Risk Factors/Patient Goals Review   Personal Goals Review Weight Management/Obesity;Improve shortness of breath with  ADL's;Hypertension    Review Catalina Antigua is doing well in rehab.  His weight is down some with the added cardio.  If he eats before coming, his heart rate gets too high for him to tolerate exercise.  He is planning to meet with dietitian as he had to reschedule his appointment.  His blood pressures are running high at home but on the lower side here in class.  He is using his PLB to help with ADLs but his heart rate gets away from him and he needs to rest to catch his breath.    Expected Outcomes Short: Gain weight and meet with dietitian Long: COntinue to monitor pressures closely             ITP Comments:  ITP Comments     Row Name 10/07/21 1436 10/08/21 1009 11/03/21 1203       ITP Comments Completed  initial medical review, 6MWT, and gym orientation. Initial ITP created and sent for review to Dr. Zetta Bills, Medical Director. First full day of exercise!  Patient was oriented to gym and equipment including functions, settings, policies, and procedures.  Patient's individual exercise prescription and treatment plan were reviewed.  All starting workloads were established based on the results of the 6 minute walk test done at initial orientation visit.  The plan for exercise progression was also introduced and progression will be customized based on patient's performance and goals. 30 Day review completed. Medical Director ITP review done, changes made as directed, and signed approval by Medical Director.   Out for medical reasons              Comments:

## 2021-11-25 ENCOUNTER — Encounter: Payer: Self-pay | Admitting: *Deleted

## 2021-11-25 NOTE — Progress Notes (Signed)
Family called with news that Masaichi passed away.

## 2021-12-24 DEATH — deceased
